# Patient Record
Sex: Female | Born: 1975 | Hispanic: No | Marital: Married | Smoking: Never smoker
Health system: Southern US, Community
[De-identification: ages and names within clinical notes are randomized; demographics above are authoritative.]

---

## 2014-11-29 ENCOUNTER — Emergency Department (HOSPITAL_BASED_OUTPATIENT_CLINIC_OR_DEPARTMENT_OTHER): Payer: 59

## 2014-11-29 ENCOUNTER — Encounter (HOSPITAL_BASED_OUTPATIENT_CLINIC_OR_DEPARTMENT_OTHER): Payer: Self-pay | Admitting: *Deleted

## 2014-11-29 ENCOUNTER — Emergency Department (HOSPITAL_BASED_OUTPATIENT_CLINIC_OR_DEPARTMENT_OTHER)
Admission: EM | Admit: 2014-11-29 | Discharge: 2014-11-30 | Disposition: A | Discharge status: Home / Self Care | Payer: 59 | Attending: Emergency Medicine | Admitting: Emergency Medicine

## 2014-11-29 DIAGNOSIS — R748 Abnormal levels of other serum enzymes: Secondary | ICD-10-CM | POA: Diagnosis not present

## 2014-11-29 DIAGNOSIS — J159 Unspecified bacterial pneumonia: Secondary | ICD-10-CM | POA: Diagnosis not present

## 2014-11-29 DIAGNOSIS — R7309 Other abnormal glucose: Secondary | ICD-10-CM

## 2014-11-29 DIAGNOSIS — R739 Hyperglycemia, unspecified: Secondary | ICD-10-CM | POA: Insufficient documentation

## 2014-11-29 DIAGNOSIS — O99512 Diseases of the respiratory system complicating pregnancy, second trimester: Secondary | ICD-10-CM | POA: Diagnosis not present

## 2014-11-29 DIAGNOSIS — O26612 Liver and biliary tract disorders in pregnancy, second trimester: Secondary | ICD-10-CM

## 2014-11-29 DIAGNOSIS — Z3A24 24 weeks gestation of pregnancy: Secondary | ICD-10-CM | POA: Diagnosis not present

## 2014-11-29 DIAGNOSIS — K8020 Calculus of gallbladder without cholecystitis without obstruction: Secondary | ICD-10-CM | POA: Diagnosis not present

## 2014-11-29 DIAGNOSIS — O99612 Diseases of the digestive system complicating pregnancy, second trimester: Secondary | ICD-10-CM | POA: Diagnosis not present

## 2014-11-29 DIAGNOSIS — R1011 Right upper quadrant pain: Secondary | ICD-10-CM

## 2014-11-29 DIAGNOSIS — O9989 Other specified diseases and conditions complicating pregnancy, childbirth and the puerperium: Secondary | ICD-10-CM | POA: Diagnosis present

## 2014-11-29 DIAGNOSIS — J189 Pneumonia, unspecified organism: Secondary | ICD-10-CM

## 2014-11-29 LAB — URINALYSIS, ROUTINE W REFLEX MICROSCOPIC
Bilirubin Urine: NEGATIVE
Glucose, UA: 250 mg/dL — AB
Hgb urine dipstick: NEGATIVE
Ketones, ur: NEGATIVE mg/dL
Leukocytes, UA: NEGATIVE
NITRITE: NEGATIVE
Protein, ur: NEGATIVE mg/dL
Specific Gravity, Urine: 1.019 (ref 1.005–1.030)
UROBILINOGEN UA: 1 mg/dL (ref 0.0–1.0)
pH: 7 (ref 5.0–8.0)

## 2014-11-29 MED ORDER — ONDANSETRON HCL 4 MG/2ML IJ SOLN
4.0000 mg | Freq: Once | INTRAMUSCULAR | Status: AC
  Administered 2014-11-29: 4 mg via INTRAVENOUS
  Filled 2014-11-29: qty 2

## 2014-11-29 MED ORDER — MORPHINE SULFATE 2 MG/ML IJ SOLN
2.0000 mg | Freq: Once | INTRAMUSCULAR | Status: AC
  Administered 2014-11-29: 2 mg via INTRAVENOUS
  Filled 2014-11-29: qty 1

## 2014-11-29 NOTE — Progress Notes (Signed)
Pt presents with complaints of epigastric pain. She states this happens when she has spicy food. FHR reassuring. [facility] [place]. OB cleared

## 2014-11-29 NOTE — ED Provider Notes (Signed)
CSN: [id number]     Arrival date & time 11/29/14  2235 History  This chart was scribed for Loren Raceravid Sharda Keddy, MD by Freida Busmaniana Omoyeni, ED Scribe. This patient was seen in room MHT14/MHT14 and the patient's care was started 11:05 PM.    Chief Complaint  Patient presents with  . Abdominal Pain      HPI   HPI Comments:  Shelley Neal is a 39 y.o. female ~ G3P1A1 [redacted] weeks pregnant  By US who presents to the Emergency Department complaining of moderate shooting pain in her mid thoracic back that started around 1930 today. She reports a similar pain in her epigastric region and RUQ that she describes as a "gas feeling". She states her pain waxes and wanes.  Pt returned from [place] 1 week ago. She has taken liquid antacid without relief. She last ate ~1400p. She reports mild dry cough. She denies nausea, fever, vaginal bleeding /discharge swelling to her bilateral lower extremities, or lower abdominal pain. No urinary symptoms. She also no h/o abdominal surgeries. She states she has not had any contractions and has felt the baby move.  History reviewed. No pertinent past medical history. History reviewed. No pertinent past surgical history. No family history on file. History  Substance Use Topics  . Smoking status: Never Smoker   . Smokeless tobacco: Not on file  . Alcohol Use: No   OB History    Gravida Para Term Preterm AB TAB SAB Ectopic Multiple Living   1              Review of Systems  Constitutional: Positive for diaphoresis. Negative for fever and chills.  Respiratory: Positive for cough. Negative for shortness of breath.   Cardiovascular: Negative for chest pain, palpitations and leg swelling.  Gastrointestinal: Positive for abdominal pain. Negative for nausea, vomiting, diarrhea and constipation.  Genitourinary: Negative for dysuria and flank pain.  Musculoskeletal: Positive for back pain. Negative for myalgias, neck pain and neck stiffness.  Skin: Negative for rash.  Neurological:  Negative for dizziness, weakness, light-headedness, numbness and headaches.  All other systems reviewed and are negative.     Allergies  Review of patient's allergies indicates no known allergies.  Home Medications   Prior to Admission medications   Medication Sig Start Date End Date Taking? Authorizing Provider  azithromycin (ZITHROMAX Z-PAK) 250 MG tablet 2 po day one, then 1 daily x 4 days 11/30/14   Loren Raceravid Camryn Lampson, MD  ondansetron (ZOFRAN ODT) 4 MG disintegrating tablet 4mg  ODT q4 hours prn nausea/vomit 11/30/14   Loren Raceravid Marsa Matteo, MD  oxyCODONE (ROXICODONE) 5 MG immediate release tablet Take 1 tablet (5 mg total) by mouth every 4 (four) hours as needed for severe pain. 11/30/14   Loren Raceravid Kahlyn Shippey, MD  Prenatal Multivit-Min-Fe-FA (PRENATAL VITAMINS PO) Take by mouth.   Yes Historical Provider, MD   BP 104/55 mmHg  Pulse 84  Temp(Src) 97.8 F (36.6 C) (Oral)  Resp 20  SpO2 100%  LMP 06/14/2014 Physical Exam  Constitutional: She is oriented to person, place, and time. She appears well-developed and well-nourished. No distress.  HENT:  Head: Normocephalic and atraumatic.  Mouth/Throat: Oropharynx is clear and moist.  Eyes: EOM are normal. Pupils are equal, round, and reactive to light.  Neck: Normal range of motion. Neck supple.  Cardiovascular: Normal rate and regular rhythm.  Exam reveals no gallop and no friction rub.   No murmur heard. Pulmonary/Chest: Effort normal and breath sounds normal. No respiratory distress. She has no wheezes. She has  no rales. She exhibits no tenderness.  Abdominal: Soft. Bowel sounds are normal. There is tenderness (epigastric and right upper quadrant tenderness with palpation.). There is no rebound and no guarding.  Gravid abdomen. No fundal tenderness  Musculoskeletal: Normal range of motion. She exhibits no edema or tenderness.  No thoracic tenderness with palpation. No bilateral CVA tenderness. No lower extremity swelling or tenderness.   Neurological: She is alert and oriented to person, place, and time.  Skin: Skin is warm and dry. No rash noted. No erythema.  Psychiatric: She has a normal mood and affect. Her behavior is normal.  Nursing note and vitals reviewed.   ED Course  Procedures   DIAGNOSTIC STUDIES:  Oxygen Saturation is 100% on RA, normal by my interpretation.    COORDINATION OF CARE:  11:11 PM Will order CXR to r/o PNA. Discussed possibility of transfer with pt and family at bedside.  Labs Review Labs Reviewed  URINALYSIS, ROUTINE W REFLEX MICROSCOPIC - Abnormal; Notable for the following:    APPearance CLOUDY (*)    Glucose, UA 250 (*)    All other components within normal limits  CBC WITH DIFFERENTIAL/PLATELET - Abnormal; Notable for the following:    WBC 11.8 (*)    Hemoglobin 11.6 (*)    Neutrophils Relative % 85 (*)    Neutro Abs 10.0 (*)    Lymphocytes Relative 9 (*)    All other components within normal limits  COMPREHENSIVE METABOLIC PANEL - Abnormal; Notable for the following:    Sodium 133 (*)    Potassium 3.4 (*)    Glucose, Bld 170 (*)    BUN 5 (*)    Creatinine, Ser 0.45 (*)    Albumin 2.9 (*)    AST 65 (*)    ALT 38 (*)    Alkaline Phosphatase 122 (*)    All other components within normal limits  LIPASE, BLOOD    Imaging Review Dg Chest 1 View  11/30/2014   CLINICAL DATA:  Acute onset of right upper quadrant abdominal pain and upper back pain. Initial encounter.  EXAM: CHEST  1 VIEW  COMPARISON:  None.  FINDINGS: The lungs are well-aerated. Vague patchy right-sided airspace opacity raises concern for pneumonia. There is no evidence of pleural effusion or pneumothorax.  The cardiomediastinal silhouette is borderline normal size. No acute osseous abnormalities are seen.  IMPRESSION: Vague patchy right-sided airspace opacity raises concern for pneumonia.   Electronically Signed   By: Roanna Raider M.D.   On: 11/30/2014 00:50   US Abdomen Complete  11/30/2014   CLINICAL DATA:   Right upper quadrant and mid back pain for 4 hr. Patient is pregnant [redacted] weeks gestation.  EXAM: ULTRASOUND ABDOMEN COMPLETE  COMPARISON:  None.  FINDINGS: Gallbladder: Tiny focal echogenic structure on the nondependent gallbladder wall measuring 4 mm consistent with a small polyp. Small collection of stones in the dependent gallbladder. No gallbladder wall thickening or sludge. Murphy's sign is negative.  Common bile duct: Diameter: 5 mm, normal  Liver: No focal lesion identified. Within normal limits in parenchymal echogenicity.  IVC: No abnormality visualized.  Pancreas: Not well visualized due to overlying bowel gas.  Spleen: Size and appearance within normal limits.  Right Kidney: Length: 10.9 cm. Echogenicity within normal limits. No mass or hydronephrosis visualized.  Left Kidney: Length: 10.5 cm. Echogenicity within normal limits. No mass or hydronephrosis visualized.  Abdominal aorta: No aneurysm visualized.  Other findings: None.  IMPRESSION: Cholelithiasis with small stones in the gallbladder. The  small benign-appearing gallbladder wall polyp. No inflammatory changes to suggest cholecystitis. Examination is otherwise unremarkable.   Electronically Signed   By: Burman Nieves M.D.   On: 11/30/2014 00:26     EKG Interpretation None      MDM   Final diagnoses:  RUQ pain  Cholelithiasis affecting pregnancy in second trimester, antepartum  Community acquired pneumonia  Elevated liver enzymes  Elevated glucose    I personally performed the services described in this documentation, which was scribed in my presence. The recorded information has been reviewed and is accurate.    Patient's pain is completely resolved. She currently has no abdominal tenderness. Chest x-ray with concern for possible right-sided pneumonia. Ultrasound with cholelithiasis without evidence of cholecystitis. Patient does have elevated white blood cell count and liver enzymes.  Fetus has been monitored in the  emergency department. Normal heart tones. Patient continues to sense fetal movement. Low suspicion for hellp syndrome given no proteinuria, hypertension, thrombocytopenia and plausible explanation for elevated liver enzymes.   Discussed with Dr. Michaell Cowing. States that with pain resolved patient can safely discharged home. Can follow-up as an outpatient with surgery. Patient is to avoid any fatty foods. Will be given return precautions for worsening pain, fever, persistent vomiting or any concerns.  Patient given IV dose of Rocephin and will be sent home on azithromycin for presumed right sided early pneumonia. Patient has been advised that she needs to follow-up with her OB regarding her elevated blood sugar.  She continues to be well-appearing in the emergency department. We'll discharge home.  Loren Racer, MD 11/30/14 [phone]

## 2014-11-29 NOTE — ED Notes (Signed)
Abdominal pain in her upper back into her right upper quadrant x 3 hours. She is [redacted] weeks pregnant. No vaginal discharge or leaking.

## 2014-11-30 LAB — CBC WITH DIFFERENTIAL/PLATELET
Basophils Absolute: 0 10*3/uL (ref 0.0–0.1)
Basophils Relative: 0 % (ref 0–1)
EOS ABS: 0.1 10*3/uL (ref 0.0–0.7)
Eosinophils Relative: 1 % (ref 0–5)
HEMATOCRIT: 36.3 % (ref 36.0–46.0)
Hemoglobin: 11.6 g/dL — ABNORMAL LOW (ref 12.0–15.0)
LYMPHS ABS: 1 10*3/uL (ref 0.7–4.0)
Lymphocytes Relative: 9 % — ABNORMAL LOW (ref 12–46)
MCH: 28.2 pg (ref 26.0–34.0)
MCHC: 32 g/dL (ref 30.0–36.0)
MCV: 88.1 fL (ref 78.0–100.0)
MONO ABS: 0.7 10*3/uL (ref 0.1–1.0)
MONOS PCT: 6 % (ref 3–12)
NEUTROS PCT: 85 % — AB (ref 43–77)
Neutro Abs: 10 10*3/uL — ABNORMAL HIGH (ref 1.7–7.7)
Platelets: 301 10*3/uL (ref 150–400)
RBC: 4.12 MIL/uL (ref 3.87–5.11)
RDW: 14 % (ref 11.5–15.5)
WBC: 11.8 10*3/uL — ABNORMAL HIGH (ref 4.0–10.5)

## 2014-11-30 LAB — COMPREHENSIVE METABOLIC PANEL
ALK PHOS: 122 U/L — AB (ref 39–117)
ALT: 38 U/L — ABNORMAL HIGH (ref 0–35)
AST: 65 U/L — AB (ref 0–37)
Albumin: 2.9 g/dL — ABNORMAL LOW (ref 3.5–5.2)
Anion gap: 5 (ref 5–15)
BUN: 5 mg/dL — ABNORMAL LOW (ref 6–23)
CALCIUM: 8.7 mg/dL (ref 8.4–10.5)
CHLORIDE: 105 mmol/L (ref 96–112)
CO2: 23 mmol/L (ref 19–32)
Creatinine, Ser: 0.45 mg/dL — ABNORMAL LOW (ref 0.50–1.10)
GFR calc Af Amer: >90 mL/min (ref >90)
GFR calc non Af Amer: >90 mL/min (ref >90)
Glucose, Bld: 170 mg/dL — ABNORMAL HIGH (ref 70–99)
POTASSIUM: 3.4 mmol/L — AB (ref 3.5–5.1)
SODIUM: 133 mmol/L — AB (ref 135–145)
Total Bilirubin: 0.6 mg/dL (ref 0.3–1.2)
Total Protein: 6.6 g/dL (ref 6.0–8.3)

## 2014-11-30 LAB — LIPASE, BLOOD: LIPASE: 28 U/L (ref 11–59)

## 2014-11-30 MED ORDER — CEFTRIAXONE SODIUM 1 G IJ SOLR
INTRAMUSCULAR | Status: AC
  Administered 2014-11-30: 1000 mg
  Filled 2014-11-30: qty 10

## 2014-11-30 MED ORDER — DEXTROSE 5 % IV SOLN: 1.0000 g | Freq: Once | INTRAVENOUS | Status: DC

## 2014-11-30 MED ORDER — ONDANSETRON 4 MG PO TBDP: ORAL_TABLET | ORAL | Status: AC

## 2014-11-30 MED ORDER — AZITHROMYCIN 250 MG PO TABS: ORAL_TABLET | ORAL | Status: AC

## 2014-11-30 MED ORDER — OXYCODONE HCL 5 MG PO TABS: 5.0000 mg | ORAL_TABLET | ORAL | Status: AC | PRN

## 2014-11-30 NOTE — ED Notes (Signed)
Upper back and epigastric pain.  States has been seen for same.  Pain usually after eating spicy foods.  Denies any other problems.  Called rapid response and she gave suggestions over phone.  Pt states she feels baby move  And denies any abd pain

## 2014-11-30 NOTE — Discharge Instructions (Signed)
Call and make an appointment to follow-up with your OB GYN regarding your elevated liver tests and elevated blood sugar. Also follow-up with general surgery regarding gallstones. Return immediately for worsening pain, fever, persistent vomiting or for any concerns.   Low-Fat Diet for Pancreatitis or Gallbladder Conditions A low-fat diet can be helpful if you have pancreatitis or a gallbladder condition. With these conditions, your pancreas and gallbladder have trouble digesting fats. A healthy eating plan with less fat will help rest your pancreas and gallbladder and reduce your symptoms. WHAT DO I NEED TO KNOW ABOUT THIS DIET?  Eat a low-fat diet.  Reduce your fat intake to less than 20-30% of your total daily calories. This is less than 50-60 g of fat per day.  Remember that you need some fat in your diet. Ask your dietician what your daily goal should be.  Choose nonfat and low-fat healthy foods. Look for the words "nonfat," "low fat," or "fat free."  As a guide, look on the label and choose foods with less than 3 g of fat per serving. Eat only one serving.  Avoid alcohol.  Do not smoke. If you need help quitting, talk with your health care provider.  Eat small frequent meals instead of three large heavy meals. WHAT FOODS CAN I EAT? Grains Include healthy grains and starches such as potatoes, wheat bread, fiber-rich cereal, and brown rice. Choose whole grain options whenever possible. In adults, whole grains should account for 45-65% of your daily calories.  Fruits and Vegetables Eat plenty of fruits and vegetables. Fresh fruits and vegetables add fiber to your diet. Meats and Other Protein Sources Eat lean meat such as chicken and pork. Trim any fat off of meat before cooking it. Eggs, fish, and beans are other sources of protein. In adults, these foods should account for 10-35% of your daily calories. Dairy Choose low-fat milk and dairy options. Dairy includes fat and protein, as  well as calcium.  Fats and Oils Limit high-fat foods such as fried foods, sweets, baked goods, sugary drinks.  Other Creamy sauces and condiments, such as mayonnaise, can add extra fat. Think about whether or not you need to use them, or use smaller amounts or low fat options. WHAT FOODS ARE NOT RECOMMENDED?  High fat foods, such as:  [employer].  Ice cream.  [place] toast.  Sweet rolls.  Pizza.  Cheese bread.  Foods covered with batter, butter, creamy sauces, or cheese.  Fried foods.  Sugary drinks and desserts.  Foods that cause gas or bloating Document Released: 10/10/2013 Document Reviewed: 10/10/2013 [facility] Patient Information 2015 [place], [place]. This information is not intended to replace advice given to you by your health care provider. Make sure you discuss any questions you have with your health care provider.    Cholelithiasis Cholelithiasis (also called gallstones) is a form of gallbladder disease in which gallstones form in your gallbladder. The gallbladder is an organ that stores bile made in the liver, which helps digest fats. Gallstones begin as small crystals and slowly grow into stones. Gallstone pain occurs when the gallbladder spasms and a gallstone is blocking the duct. Pain can also occur when a stone passes out of the duct.  RISK FACTORS  Being female.   Having multiple pregnancies. Health care providers sometimes advise removing diseased gallbladders before future pregnancies.   Being obese.  Eating a diet heavy in fried foods and fat.   Being older than 60 years and increasing age.   Prolonged use  of medicines containing female hormones.   Having diabetes mellitus.   Rapidly losing weight.   Having a family history of gallstones (heredity).  SYMPTOMS  Nausea.   Vomiting.  Abdominal pain.   Yellowing of the skin (jaundice).   Sudden pain. It may persist from several minutes to several hours.  Fever.    Tenderness to the touch. In some cases, when gallstones do not move into the bile duct, people have no pain or symptoms. These are called "silent" gallstones.  TREATMENT Silent gallstones do not need treatment. In severe cases, emergency surgery may be required. Options for treatment include:  Surgery to remove the gallbladder. This is the most common treatment.  Medicines. These do not always work and may take 6-12 months or more to work.  Shock wave treatment (extracorporeal biliary lithotripsy). In this treatment an ultrasound machine sends shock waves to the gallbladder to break gallstones into smaller pieces that can pass into the intestines or be dissolved by medicine. HOME CARE INSTRUCTIONS   Only take over-the-counter or prescription medicines for pain, discomfort, or fever as directed by your health care provider.   Follow a low-fat diet until seen again by your health care provider. Fat causes the gallbladder to contract, which can result in pain.   Follow up with your health care provider as directed. Attacks are almost always recurrent and surgery is usually required for permanent treatment.  SEEK IMMEDIATE MEDICAL CARE IF:   Your pain increases and is not controlled by medicines.   You have a fever or persistent symptoms for more than 2-3 days.   You have a fever and your symptoms suddenly get worse.   You have persistent nausea and vomiting.  MAKE SURE YOU:   Understand these instructions.  Will watch your condition.  Will get help right away if you are not doing well or get worse. Document Released: 10/01/2005 Document Revised: 06/07/2013 Document Reviewed: 03/29/2013 [facility] Patient Information 2015 [place], [place]. This information is not intended to replace advice given to you by your health care provider. Make sure you discuss any questions you have with your health care provider.  Pneumonia Pneumonia is an infection of the lungs.   CAUSES Pneumonia may be caused by bacteria or a virus. Usually, these infections are caused by breathing infectious particles into the lungs (respiratory tract). SIGNS AND SYMPTOMS   Cough.  Fever.  Chest pain.  Increased rate of breathing.  Wheezing.  Mucus production. DIAGNOSIS  If you have the common symptoms of pneumonia, your health care provider will typically confirm the diagnosis with a chest X-ray. The X-ray will show an abnormality in the lung (pulmonary infiltrate) if you have pneumonia. Other tests of your blood, urine, or sputum may be done to find the specific cause of your pneumonia. Your health care provider may also do tests (blood gases or pulse oximetry) to see how well your lungs are working. TREATMENT  Some forms of pneumonia may be spread to other people when you cough or sneeze. You may be asked to wear a mask before and during your exam. Pneumonia that is caused by bacteria is treated with antibiotic medicine. Pneumonia that is caused by the influenza virus may be treated with an antiviral medicine. Most other viral infections must run their course. These infections will not respond to antibiotics.  HOME CARE INSTRUCTIONS   Cough suppressants may be used if you are losing too much rest. However, coughing protects you by clearing your lungs. You should avoid  using cough suppressants if you can.  Your health care provider may have prescribed medicine if he or she thinks your pneumonia is caused by bacteria or influenza. Finish your medicine even if you start to feel better.  Your health care provider may also prescribe an expectorant. This loosens the mucus to be coughed up.  Take medicines only as directed by your health care provider.  Do not smoke. Smoking is a common cause of bronchitis and can contribute to pneumonia. If you are a smoker and continue to smoke, your cough may last several weeks after your pneumonia has cleared.  A cold steam vaporizer or  humidifier in your room or home may help loosen mucus.  Coughing is often worse at night. Sleeping in a semi-upright position in a recliner or using a couple pillows under your head will help with this.  Get rest as you feel it is needed. Your body will usually let you know when you need to rest. PREVENTION A pneumococcal shot (vaccine) is available to prevent a common bacterial cause of pneumonia. This is usually suggested for:  People over 36 years old.  Patients on chemotherapy.  People with chronic lung problems, such as bronchitis or emphysema.  People with immune system problems. If you are over 65 or have a high risk condition, you may receive the pneumococcal vaccine if you have not received it before. In some countries, a routine influenza vaccine is also recommended. This vaccine can help prevent some cases of pneumonia.You may be offered the influenza vaccine as part of your care. If you smoke, it is time to quit. You may receive instructions on how to stop smoking. Your health care provider can provide medicines and counseling to help you quit. SEEK MEDICAL CARE IF: You have a fever. SEEK IMMEDIATE MEDICAL CARE IF:   Your illness becomes worse. This is especially true if you are elderly or weakened from any other disease.  You cannot control your cough with suppressants and are losing sleep.  You begin coughing up blood.  You develop pain which is getting worse or is uncontrolled with medicines.  Any of the symptoms which initially brought you in for treatment are getting worse rather than better.  You develop shortness of breath or chest pain. MAKE SURE YOU:   Understand these instructions.  Will watch your condition.  Will get help right away if you are not doing well or get worse. Document Released: 10/05/2005 Document Revised: 02/19/2014 Document Reviewed: 12/25/2010 [facility] Patient Information 2015 [place], [place]. This information is not intended to replace  advice given to you by your health care provider. Make sure you discuss any questions you have with your health care provider.

## 2016-02-29 IMAGING — CR DG CHEST 1V
1 series · 1 of 1 positions shown · non-contrast
Comparison: None.

CLINICAL DATA: Acute onset of right upper quadrant abdominal pain
and upper back pain. Initial encounter.

EXAM:
CHEST  1 VIEW

[w chest pa]
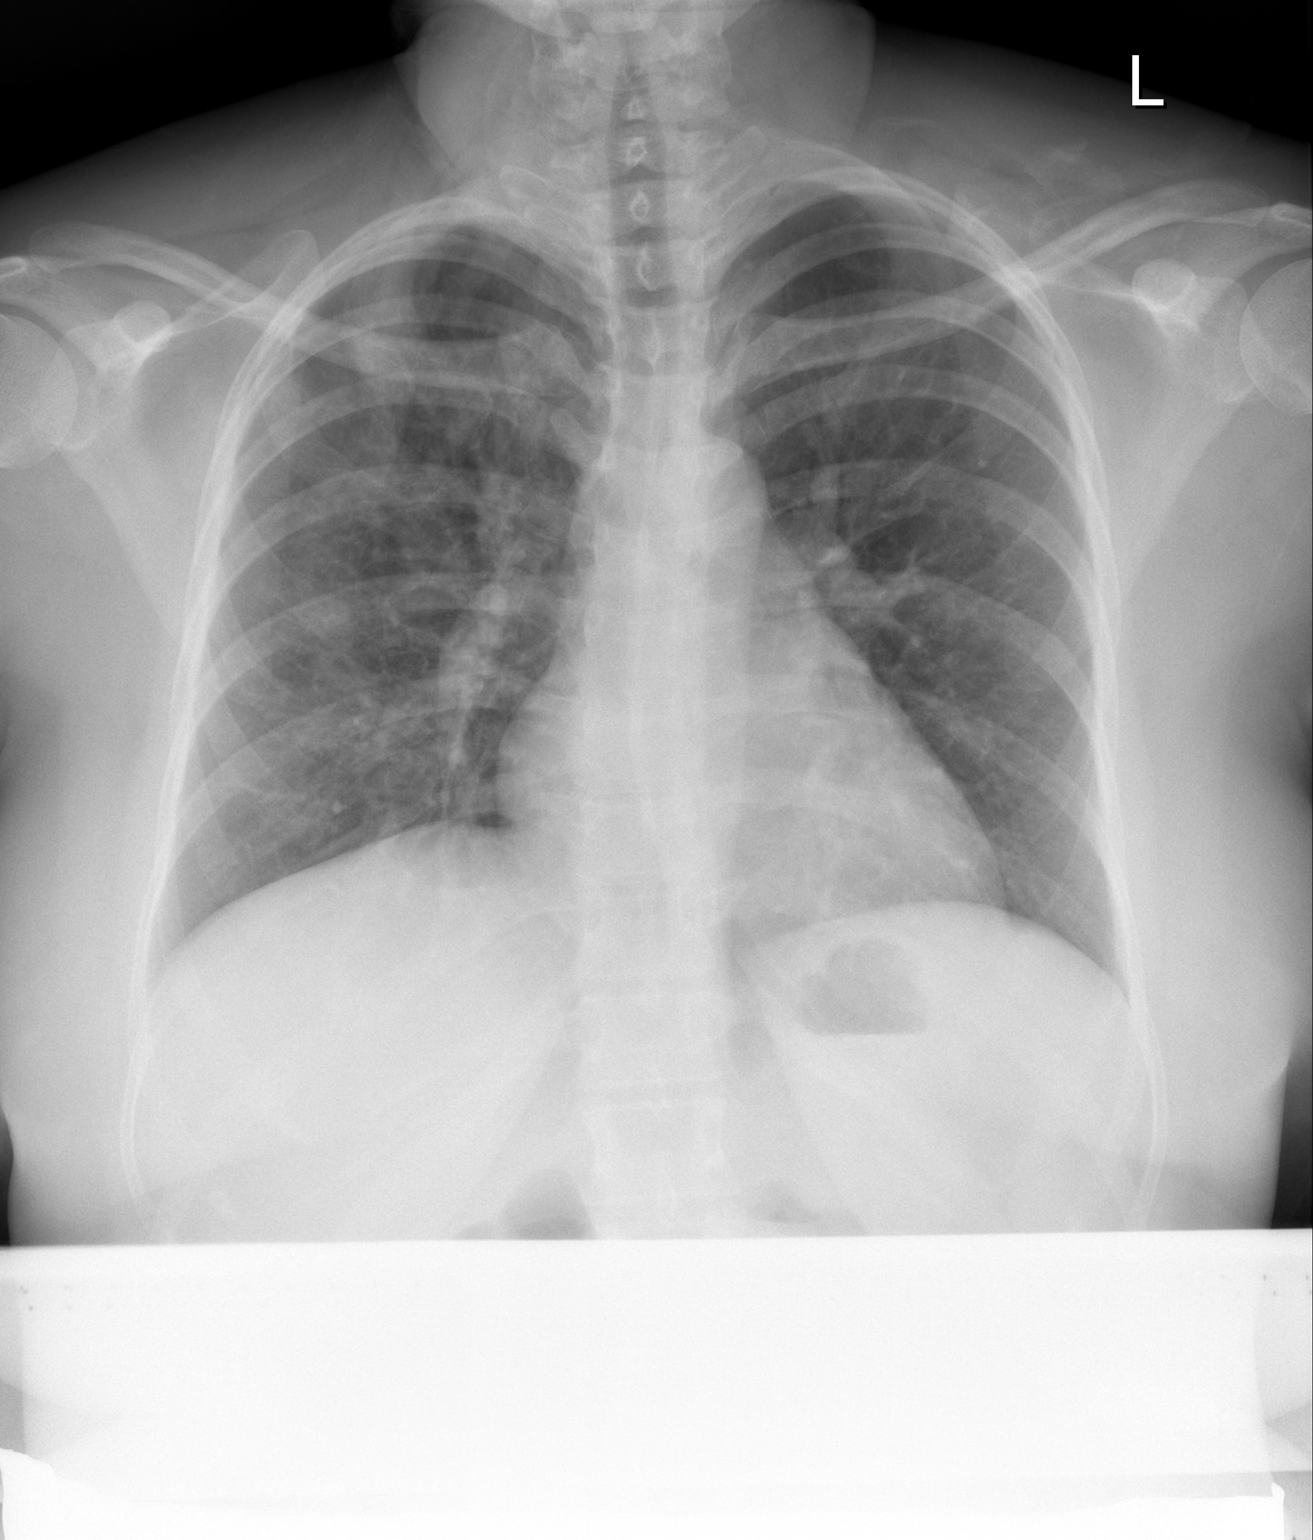

[1 of 1 positions shown; findings below may reference images not displayed]

FINDINGS: The lungs are well-aerated. Vague patchy right-sided airspace
opacity raises concern for pneumonia. There is no evidence of
pleural effusion or pneumothorax.

The cardiomediastinal silhouette is borderline normal size. No acute
osseous abnormalities are seen.
IMPRESSION: Vague patchy right-sided airspace opacity raises concern for
pneumonia.

## 2016-02-29 IMAGING — US US ABDOMEN COMPLETE
1 series · 14 of 25 positions shown · non-contrast
Comparison: None.

CLINICAL DATA: Right upper quadrant and mid back pain for 4 hr.
Patient is pregnant 24 weeks gestation.

EXAM:
ULTRASOUND ABDOMEN COMPLETE

[Series 1: us abdomen complete · 0.24mm/px · 14 of 73 slices shown]
[im 1/73]
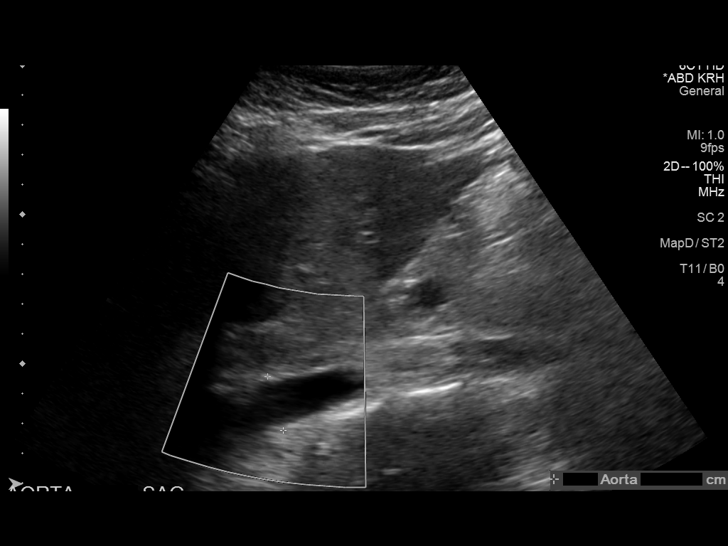
[im 7/73]
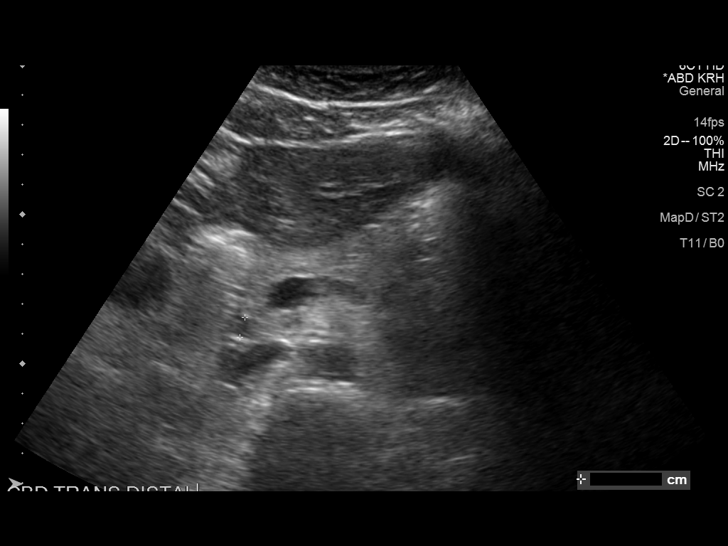
[im 13/73]
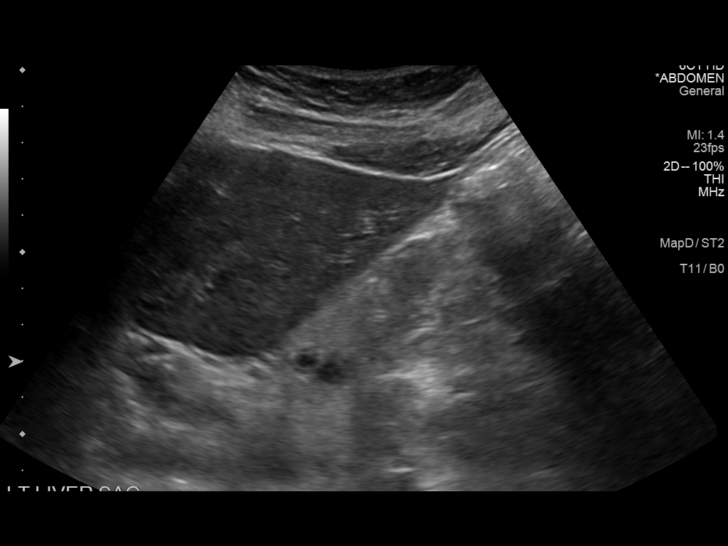
[im 19/73]
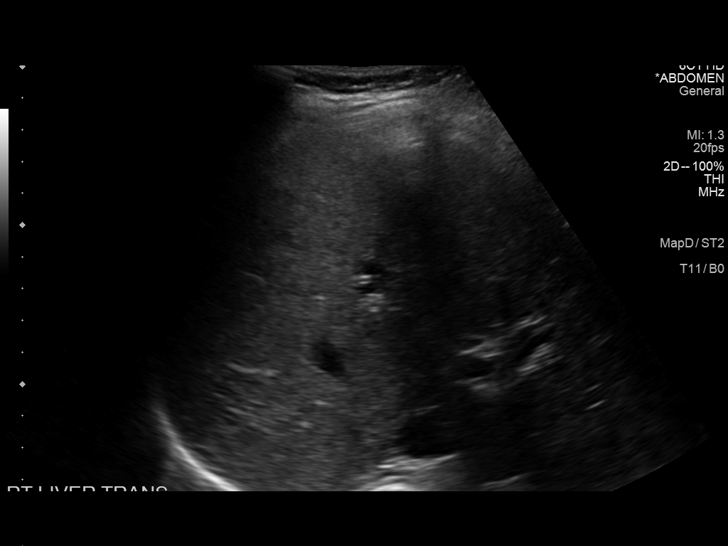
[im 25/73]
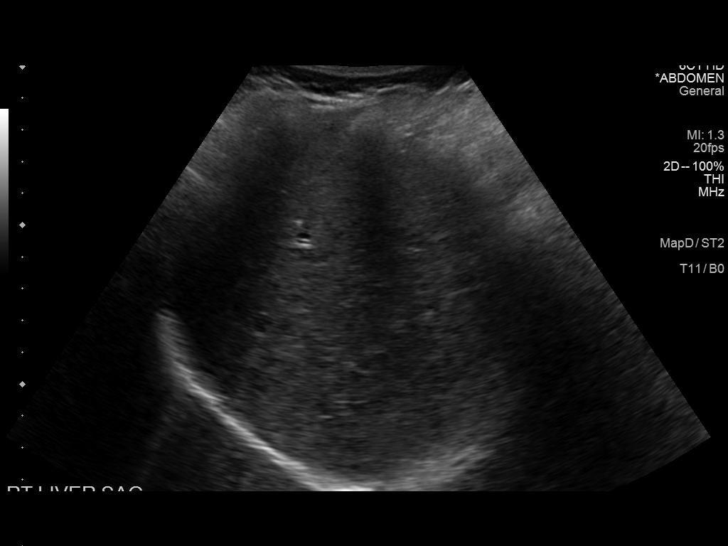
[im 28/73]
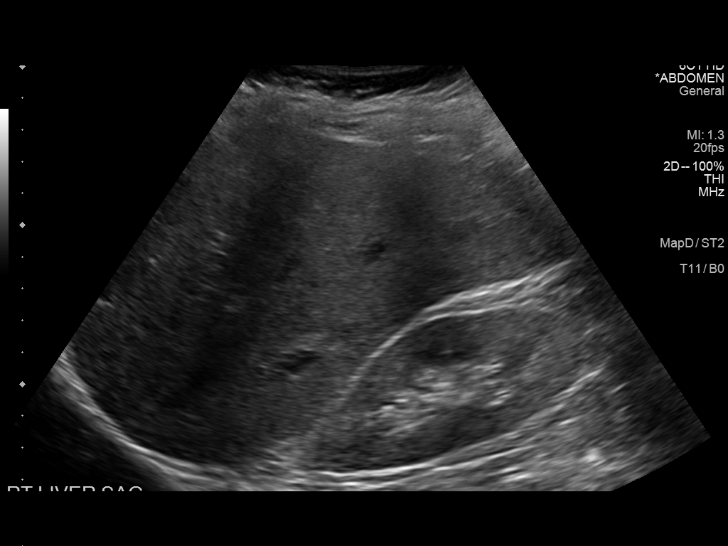
[im 34/73]
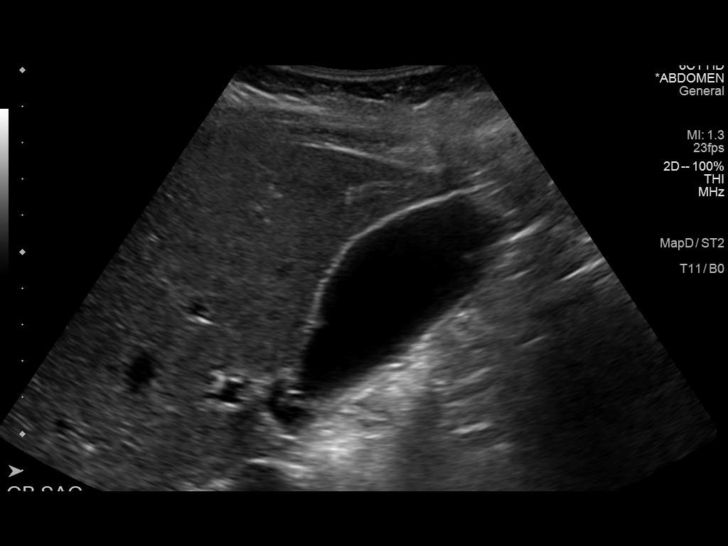
[im 40/73]
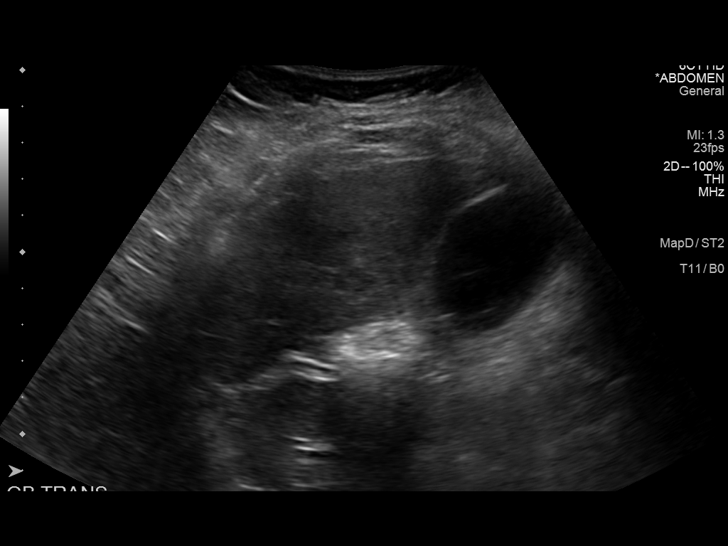
[im 46/73]
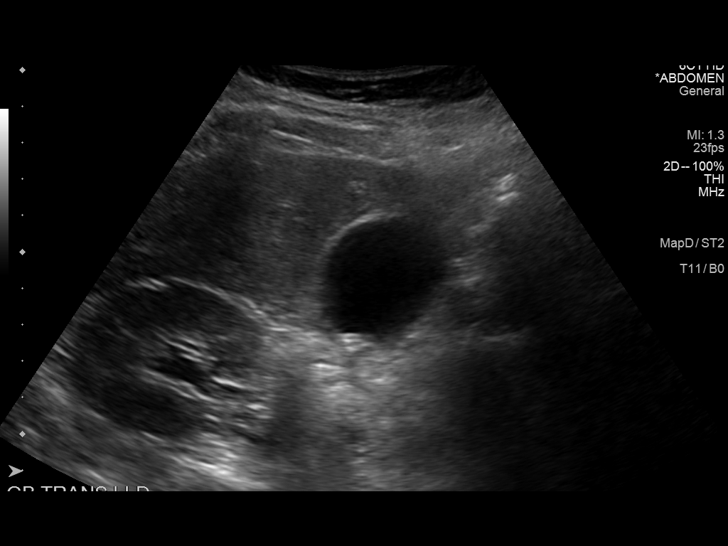
[im 49/73]
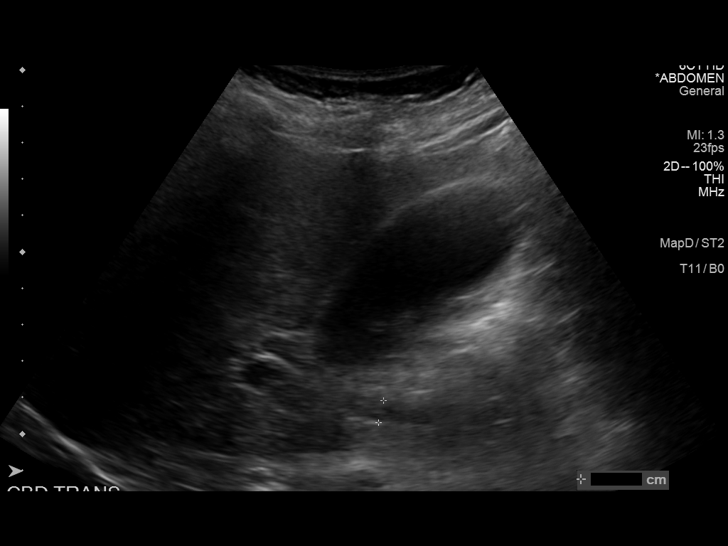
[im 55/73]
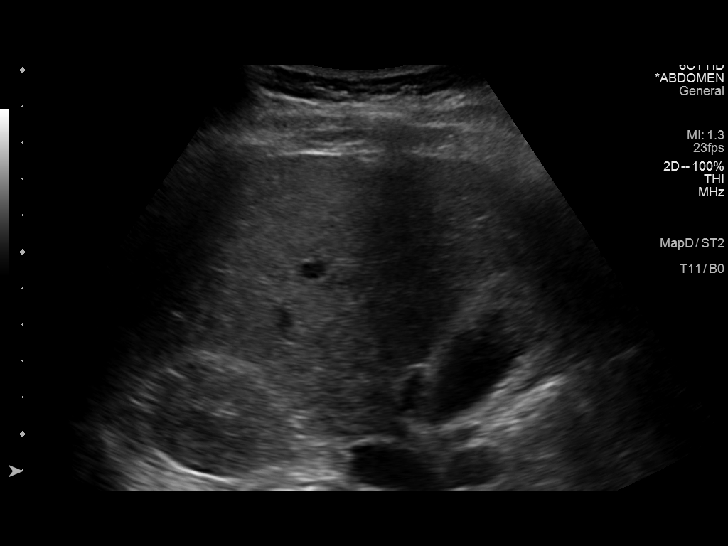
[im 61/73]
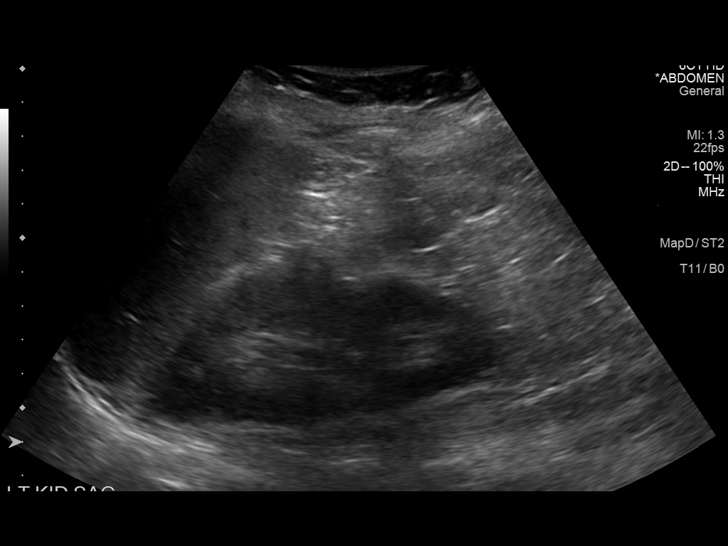
[im 67/73]
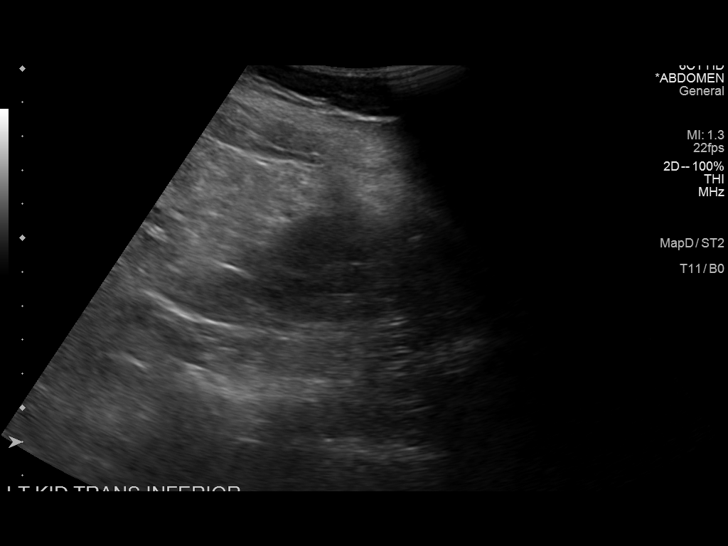
[im 73/73]
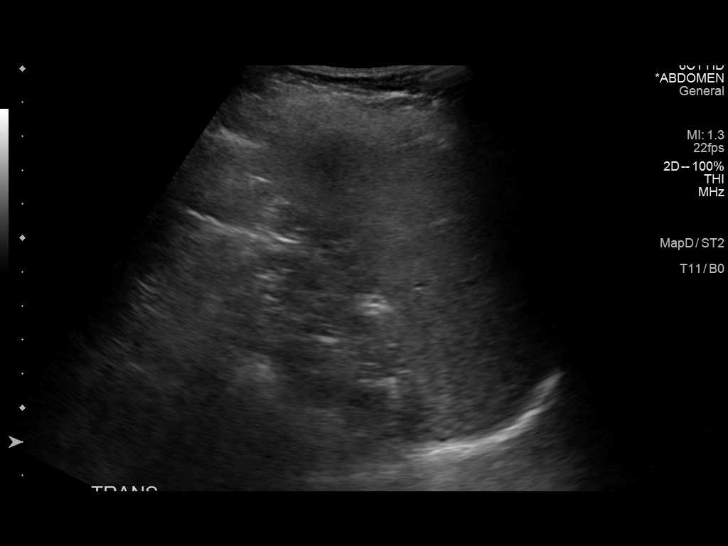

[14 of 25 positions shown; findings below may reference images not displayed]

FINDINGS: Gallbladder: Tiny focal echogenic structure on the nondependent
gallbladder wall measuring 4 mm consistent with a small polyp. Small
collection of stones in the dependent gallbladder. No gallbladder
wall thickening or sludge. Murphy's sign is negative.

Common bile duct: Diameter: 5 mm, normal

Liver: No focal lesion identified. Within normal limits in
parenchymal echogenicity.

IVC: No abnormality visualized.

Pancreas: Not well visualized due to overlying bowel gas.

Spleen: Size and appearance within normal limits.

Right Kidney: Length: 10.9 cm. Echogenicity within normal limits. No
mass or hydronephrosis visualized.

Left Kidney: Length: 10.5 cm. Echogenicity within normal limits. No
mass or hydronephrosis visualized.

Abdominal aorta: No aneurysm visualized.

Other findings: None.
IMPRESSION: Cholelithiasis with small stones in the gallbladder. The small
benign-appearing gallbladder wall polyp. No inflammatory changes to
suggest cholecystitis. Examination is otherwise unremarkable.
